# Patient Record
Sex: Male | Born: 1980 | Race: White | Hispanic: No | Marital: Single | Smoking: Current every day smoker
Health system: Southern US, Community
[De-identification: ages and names within clinical notes are randomized; demographics above are authoritative.]

## PROBLEM LIST (undated history)

## (undated) DIAGNOSIS — S82892A Other fracture of left lower leg, initial encounter for closed fracture: Secondary | ICD-10-CM

## (undated) DIAGNOSIS — I10 Essential (primary) hypertension: Secondary | ICD-10-CM

## (undated) HISTORY — PX: MANDIBLE SURGERY: SHX707

## (undated) HISTORY — DX: Essential (primary) hypertension: I10

## (undated) HISTORY — PX: BLADDER REPAIR: SHX76

---

## 2003-04-15 ENCOUNTER — Other Ambulatory Visit (HOSPITAL_COMMUNITY): Admission: RE | Admit: 2003-04-15 | Discharge: 2003-04-18 | Payer: Self-pay | Admitting: Psychiatry

## 2004-07-05 ENCOUNTER — Emergency Department (HOSPITAL_COMMUNITY): Admission: EM | Admit: 2004-07-05 | Discharge: 2004-07-05 | Payer: Self-pay | Admitting: Emergency Medicine

## 2005-08-10 ENCOUNTER — Ambulatory Visit: Payer: Self-pay | Admitting: Licensed Clinical Social Worker

## 2005-12-28 ENCOUNTER — Emergency Department (HOSPITAL_COMMUNITY): Admission: EM | Admit: 2005-12-28 | Discharge: 2005-12-28 | Payer: Self-pay | Admitting: Emergency Medicine

## 2009-02-21 ENCOUNTER — Encounter (HOSPITAL_BASED_OUTPATIENT_CLINIC_OR_DEPARTMENT_OTHER): Admission: RE | Admit: 2009-02-21 | Discharge: 2009-04-08 | Payer: Self-pay | Admitting: General Surgery

## 2009-10-21 ENCOUNTER — Emergency Department (HOSPITAL_COMMUNITY): Admission: EM | Admit: 2009-10-21 | Discharge: 2009-10-21 | Payer: Self-pay

## 2010-05-19 ENCOUNTER — Encounter (HOSPITAL_BASED_OUTPATIENT_CLINIC_OR_DEPARTMENT_OTHER): Payer: Self-pay | Attending: General Surgery

## 2010-05-19 DIAGNOSIS — L02219 Cutaneous abscess of trunk, unspecified: Secondary | ICD-10-CM | POA: Insufficient documentation

## 2010-05-19 DIAGNOSIS — F17200 Nicotine dependence, unspecified, uncomplicated: Secondary | ICD-10-CM | POA: Insufficient documentation

## 2010-05-19 DIAGNOSIS — L03319 Cellulitis of trunk, unspecified: Secondary | ICD-10-CM | POA: Insufficient documentation

## 2010-06-02 ENCOUNTER — Encounter (HOSPITAL_BASED_OUTPATIENT_CLINIC_OR_DEPARTMENT_OTHER): Payer: Self-pay | Attending: General Surgery

## 2010-06-02 DIAGNOSIS — L02219 Cutaneous abscess of trunk, unspecified: Secondary | ICD-10-CM | POA: Insufficient documentation

## 2010-06-02 DIAGNOSIS — F17200 Nicotine dependence, unspecified, uncomplicated: Secondary | ICD-10-CM | POA: Insufficient documentation

## 2010-06-02 DIAGNOSIS — L03319 Cellulitis of trunk, unspecified: Secondary | ICD-10-CM | POA: Insufficient documentation

## 2010-07-14 ENCOUNTER — Encounter (HOSPITAL_BASED_OUTPATIENT_CLINIC_OR_DEPARTMENT_OTHER): Payer: Self-pay | Attending: General Surgery

## 2010-07-14 DIAGNOSIS — I10 Essential (primary) hypertension: Secondary | ICD-10-CM | POA: Insufficient documentation

## 2010-07-14 DIAGNOSIS — S21209A Unspecified open wound of unspecified back wall of thorax without penetration into thoracic cavity, initial encounter: Secondary | ICD-10-CM | POA: Insufficient documentation

## 2010-07-14 DIAGNOSIS — X58XXXA Exposure to other specified factors, initial encounter: Secondary | ICD-10-CM | POA: Insufficient documentation

## 2010-08-07 ENCOUNTER — Encounter (INDEPENDENT_AMBULATORY_CARE_PROVIDER_SITE_OTHER): Payer: Self-pay | Admitting: General Surgery

## 2010-08-10 ENCOUNTER — Ambulatory Visit (INDEPENDENT_AMBULATORY_CARE_PROVIDER_SITE_OTHER): Payer: Self-pay | Admitting: General Surgery

## 2010-08-10 ENCOUNTER — Encounter (INDEPENDENT_AMBULATORY_CARE_PROVIDER_SITE_OTHER): Payer: Self-pay | Admitting: General Surgery

## 2010-08-10 DIAGNOSIS — S21209A Unspecified open wound of unspecified back wall of thorax without penetration into thoracic cavity, initial encounter: Secondary | ICD-10-CM
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
## 2010-08-10 NOTE — Patient Instructions (Signed)
Schedule for surgery after August 11th.

## 2010-08-11 ENCOUNTER — Encounter (HOSPITAL_BASED_OUTPATIENT_CLINIC_OR_DEPARTMENT_OTHER): Payer: Self-pay

## 2010-08-25 ENCOUNTER — Encounter (HOSPITAL_BASED_OUTPATIENT_CLINIC_OR_DEPARTMENT_OTHER): Payer: Self-pay | Attending: General Surgery

## 2010-08-25 DIAGNOSIS — X58XXXA Exposure to other specified factors, initial encounter: Secondary | ICD-10-CM | POA: Insufficient documentation

## 2010-08-25 DIAGNOSIS — S21209A Unspecified open wound of unspecified back wall of thorax without penetration into thoracic cavity, initial encounter: Secondary | ICD-10-CM | POA: Insufficient documentation

## 2010-09-08 ENCOUNTER — Encounter (HOSPITAL_BASED_OUTPATIENT_CLINIC_OR_DEPARTMENT_OTHER): Payer: Self-pay | Attending: General Surgery

## 2010-09-08 DIAGNOSIS — X58XXXA Exposure to other specified factors, initial encounter: Secondary | ICD-10-CM | POA: Insufficient documentation

## 2010-09-08 DIAGNOSIS — S21209A Unspecified open wound of unspecified back wall of thorax without penetration into thoracic cavity, initial encounter: Secondary | ICD-10-CM | POA: Insufficient documentation

## 2010-09-23 ENCOUNTER — Ambulatory Visit (HOSPITAL_COMMUNITY): Admission: RE | Admit: 2010-09-23 | Payer: Self-pay | Source: Ambulatory Visit | Admitting: General Surgery
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
## 2012-02-22 IMAGING — CR DG WRIST COMPLETE 3+V*L*
3 series · 3 of 3 positions shown · non-contrast
Comparison: 10/21/2009

CLINICAL DATA: Splinting of the wrist.

LEFT WRIST - COMPLETE 3+ VIEW

[x wrist pa left]
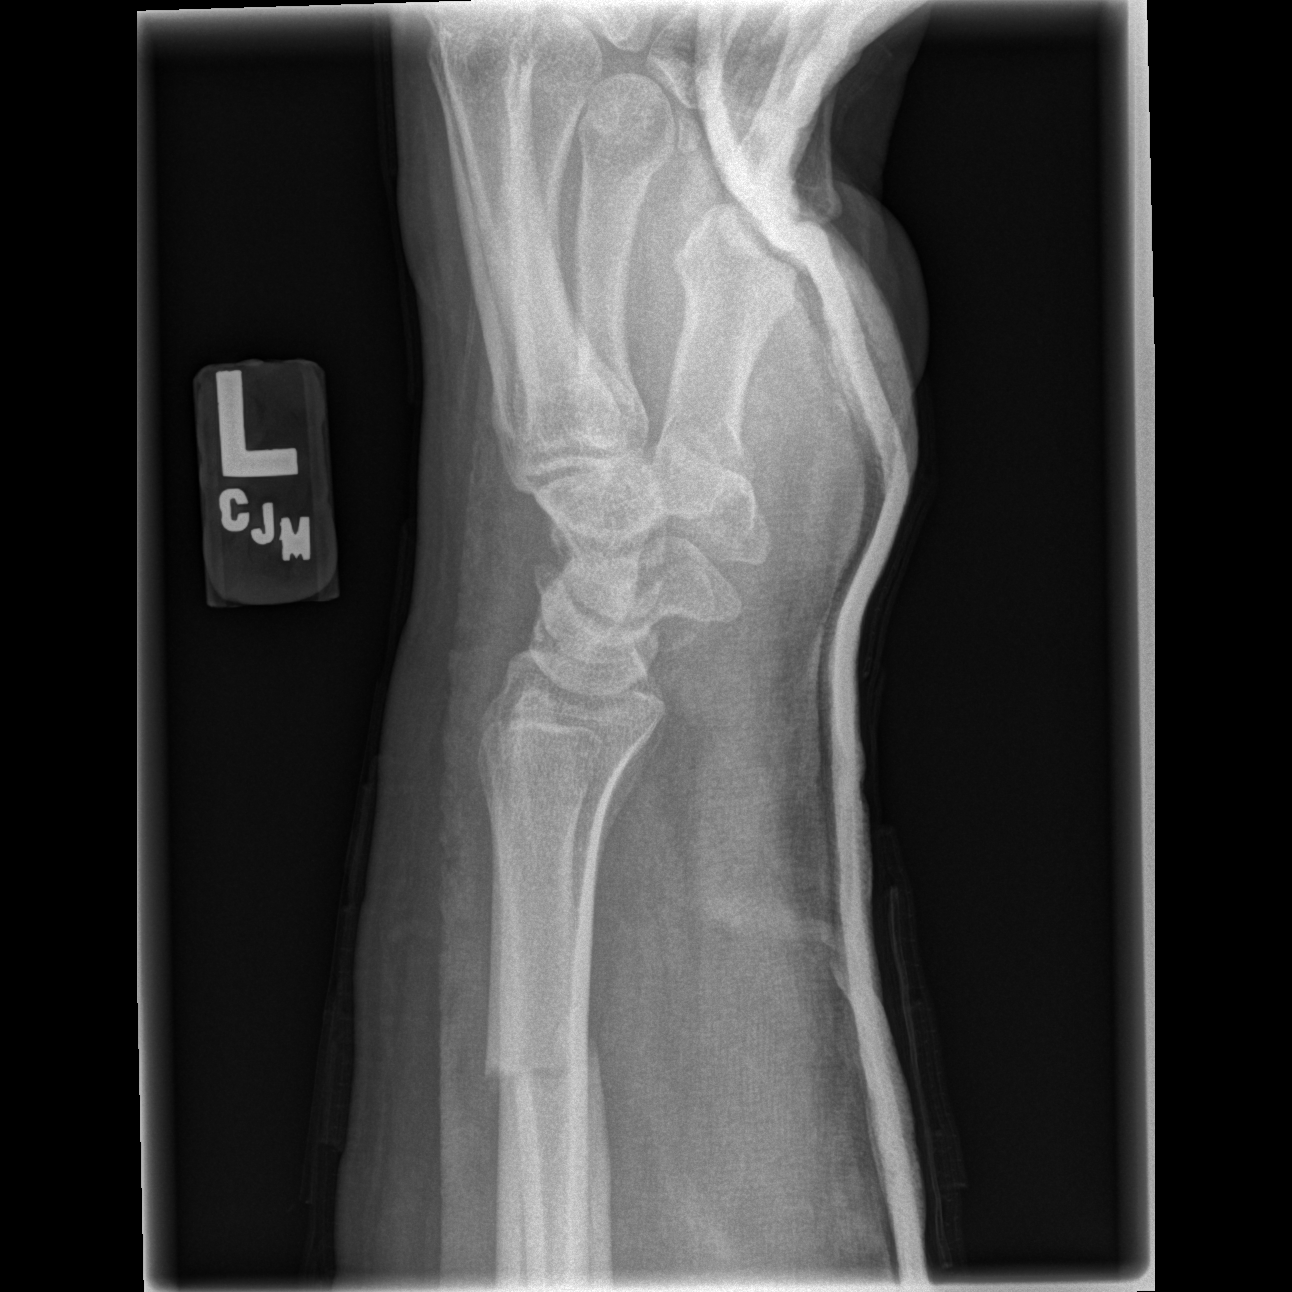

[x wrist obl left]
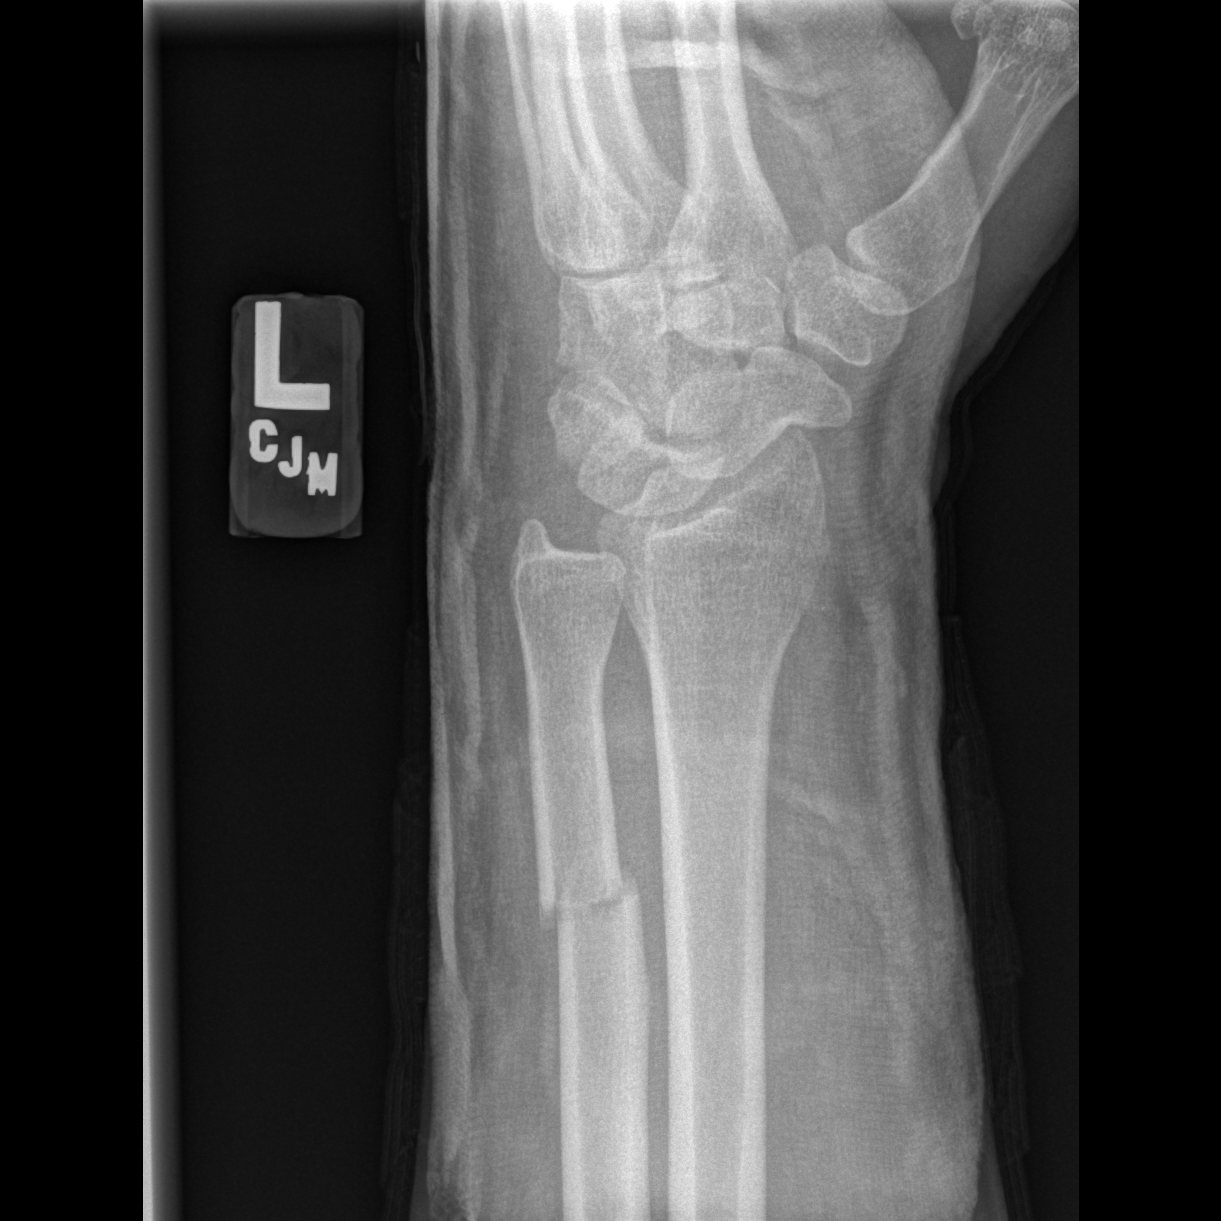

[x wrist lat left]
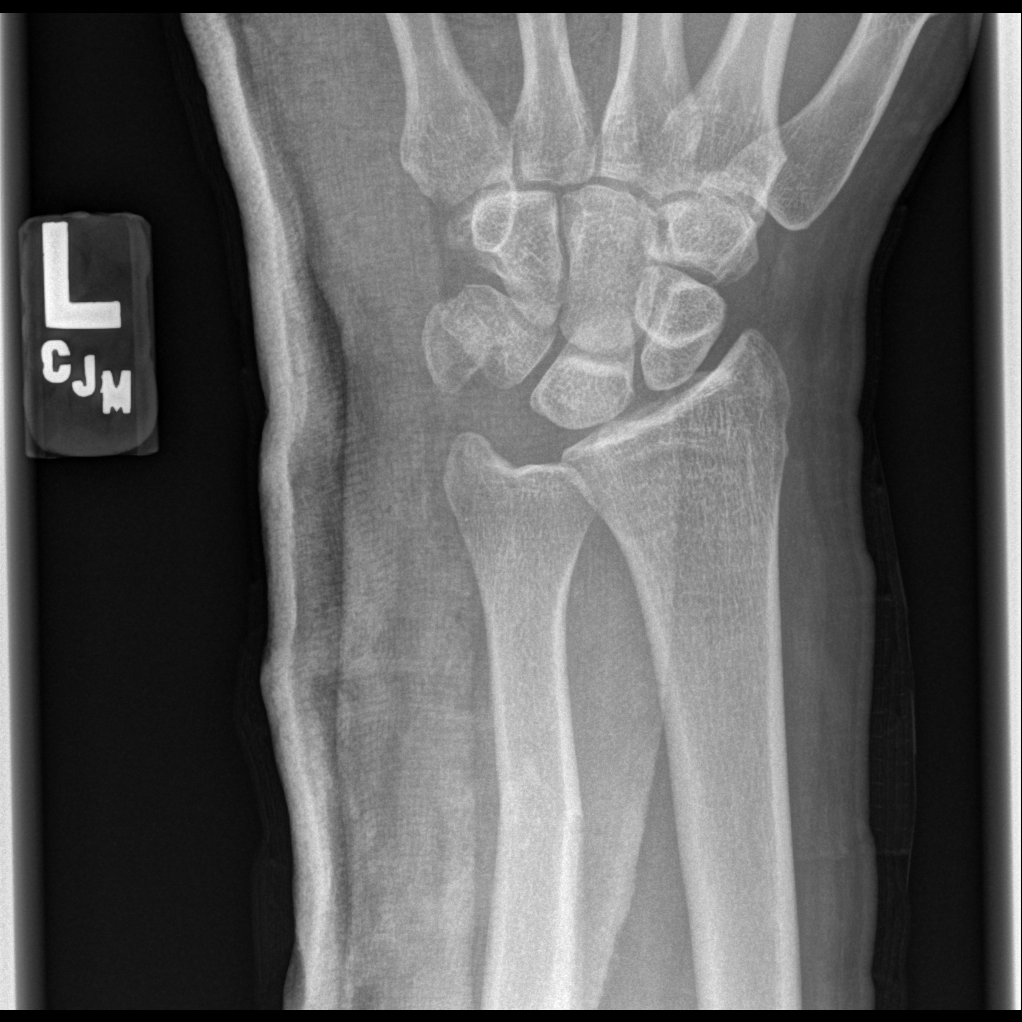

[3 of 3 positions shown; findings below may reference images not displayed]

FINDINGS: Patient has undergone reduction of distal ulnar fracture.
Alignment is near anatomic.  Detail is obscured by splinting
material.  No evidence for displaced radial fracture.
IMPRESSION: Status post reduction of ulnar fracture.

## 2013-05-02 DIAGNOSIS — S82892A Other fracture of left lower leg, initial encounter for closed fracture: Secondary | ICD-10-CM

## 2013-05-02 HISTORY — DX: Other fracture of left lower leg, initial encounter for closed fracture: S82.892A

## 2013-05-28 ENCOUNTER — Encounter (HOSPITAL_BASED_OUTPATIENT_CLINIC_OR_DEPARTMENT_OTHER): Payer: Self-pay | Admitting: *Deleted

## 2013-05-28 NOTE — H&P (Signed)
  [facility] [address]   [address] [place], [place] [phone] A Division of [facility]  Loreta Aveaniel F. Meziah Blasingame, M.D.   Robert A. Thurston HoleWainer, M.D.   Burnell BlanksW. Dan Caffrey, M.D.   Eulas PostJoshua P. Landau, M.D.   Lunette StandsAnna Voytek, M.D Jewel Baizeimothy D. Eulah PontMurphy, M.D.  Buford DresserWesley R. Ibazebo, M.D.  Estell HarpinJames S. Kramer, M.D.    Melina Fiddlerebecca S. Bassett, M.D. Mary L. Isidoro DonningAnton, PA-C  Kirstin A. Shepperson, PA-C  Josh Boylehadwell, PA-C CubaBrandon Parry, [place]A-C   RE: Greig RightBechtel, Lawson   [phone]      DOB: 11/26/1980 PROGRESS NOTE: 05-23-13 Reason for visit:  Left ankle injury, referral from Dr. Sherlean FootLucey. Date of injury: 4/21. History of present illness: Late that evening he stepped wrong and twisted his ankle off the curb. He had immediate pain in his left ankle. He was splinted by Dr. Sherlean FootLucey yesterday and referred to me.    Please see associated documentation for this clinic visit for further past medical, family, surgical and social history, review of systems, and exam findings as this was reviewed by me.  EXAMINATION: Well appearing male in no apparent distress. Splint is clean dry and intact. He has sensation intact to his toes and is able to wiggle his toes without pain.   IMAGING: X-rays reviewed by me: 3 views of the ankle demonstrate functional bi-mal with possible syndesmotic injury.     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  PLAN: 1. I discussed options. I feel he would benefit with open reduction internal fixation of this with possible syndesmotic fixation, we will schedule this within the next week. 2. He will elevate as much as possible.   Jewel Baizeimothy D.  Eulah PontMurphy, M.D.  Electronically verified by Jewel Baizeimothy D. Eulah PontMurphy, M.D. TDM:kah D 05-23-13 T 05-24-13

## 2013-05-29 ENCOUNTER — Encounter (HOSPITAL_BASED_OUTPATIENT_CLINIC_OR_DEPARTMENT_OTHER): Payer: Self-pay | Admitting: Anesthesiology

## 2013-05-29 ENCOUNTER — Ambulatory Visit (HOSPITAL_BASED_OUTPATIENT_CLINIC_OR_DEPARTMENT_OTHER)
Admission: RE | Admit: 2013-05-29 | Discharge: 2013-05-29 | Disposition: A | Discharge status: Home / Self Care | Payer: Self-pay | Source: Ambulatory Visit | Attending: Orthopedic Surgery | Admitting: Orthopedic Surgery

## 2013-05-29 ENCOUNTER — Encounter (HOSPITAL_BASED_OUTPATIENT_CLINIC_OR_DEPARTMENT_OTHER)
Admission: RE | Disposition: A | Discharge status: Home / Self Care | Payer: Self-pay | Source: Ambulatory Visit | Attending: Orthopedic Surgery

## 2013-05-29 ENCOUNTER — Ambulatory Visit (HOSPITAL_BASED_OUTPATIENT_CLINIC_OR_DEPARTMENT_OTHER): Payer: 59 | Admitting: Anesthesiology

## 2013-05-29 DIAGNOSIS — Y929 Unspecified place or not applicable: Secondary | ICD-10-CM | POA: Insufficient documentation

## 2013-05-29 DIAGNOSIS — F17200 Nicotine dependence, unspecified, uncomplicated: Secondary | ICD-10-CM | POA: Insufficient documentation

## 2013-05-29 DIAGNOSIS — X500XXA Overexertion from strenuous movement or load, initial encounter: Secondary | ICD-10-CM | POA: Insufficient documentation

## 2013-05-29 DIAGNOSIS — I10 Essential (primary) hypertension: Secondary | ICD-10-CM | POA: Insufficient documentation

## 2013-05-29 DIAGNOSIS — S8263XA Displaced fracture of lateral malleolus of unspecified fibula, initial encounter for closed fracture: Secondary | ICD-10-CM | POA: Insufficient documentation

## 2013-05-29 DIAGNOSIS — S93439A Sprain of tibiofibular ligament of unspecified ankle, initial encounter: Secondary | ICD-10-CM | POA: Insufficient documentation

## 2013-05-29 HISTORY — PX: ORIF ANKLE FRACTURE: SHX5408

## 2013-05-29 LAB — POCT HEMOGLOBIN-HEMACUE: Hemoglobin: 15.5 g/dL (ref 13.0–17.0)

## 2013-05-29 SURGERY — OPEN REDUCTION INTERNAL FIXATION (ORIF) ANKLE FRACTURE
Anesthesia: Regional | Site: Ankle | Laterality: Left

## 2013-05-29 MED ORDER — ACETAMINOPHEN 500 MG PO TABS
ORAL_TABLET | ORAL | Status: AC
  Filled 2013-05-29: qty 2

## 2013-05-29 MED ORDER — DEXAMETHASONE SODIUM PHOSPHATE 4 MG/ML IJ SOLN
INTRAMUSCULAR | Status: DC | PRN
Start: 2013-05-29 — End: 2013-05-29
  Administered 2013-05-29: 10 mg via INTRAVENOUS

## 2013-05-29 MED ORDER — FENTANYL CITRATE 0.05 MG/ML IJ SOLN
50.0000 ug | INTRAMUSCULAR | Status: DC | PRN
  Administered 2013-05-29: 100 ug via INTRAVENOUS

## 2013-05-29 MED ORDER — PROPOFOL 10 MG/ML IV BOLUS
INTRAVENOUS | Status: DC | PRN
  Administered 2013-05-29: 200 mg via INTRAVENOUS

## 2013-05-29 MED ORDER — HYDROMORPHONE HCL PF 1 MG/ML IJ SOLN
INTRAMUSCULAR | Status: AC
  Filled 2013-05-29: qty 1

## 2013-05-29 MED ORDER — CEFAZOLIN SODIUM-DEXTROSE 2-3 GM-% IV SOLR
INTRAVENOUS | Status: AC
  Filled 2013-05-29: qty 50

## 2013-05-29 MED ORDER — ONDANSETRON HCL 4 MG/2ML IJ SOLN
INTRAMUSCULAR | Status: DC | PRN
  Administered 2013-05-29: 4 mg via INTRAVENOUS

## 2013-05-29 MED ORDER — ONDANSETRON HCL 4 MG PO TABS: 4.0000 mg | ORAL_TABLET | Freq: Three times a day (TID) | ORAL | Status: DC | PRN

## 2013-05-29 MED ORDER — MIDAZOLAM HCL 2 MG/ML PO SYRP: 12.0000 mg | ORAL_SOLUTION | Freq: Once | ORAL | Status: DC | PRN

## 2013-05-29 MED ORDER — DEXTROSE-NACL 5-0.45 % IV SOLN: 100.0000 mL/h | INTRAVENOUS | Status: DC

## 2013-05-29 MED ORDER — DOCUSATE SODIUM 100 MG PO CAPS: 100.0000 mg | ORAL_CAPSULE | Freq: Two times a day (BID) | ORAL | Status: DC

## 2013-05-29 MED ORDER — FENTANYL CITRATE 0.05 MG/ML IJ SOLN
INTRAMUSCULAR | Status: AC
  Filled 2013-05-29: qty 6

## 2013-05-29 MED ORDER — MIDAZOLAM HCL 5 MG/5ML IJ SOLN
INTRAMUSCULAR | Status: DC | PRN
  Administered 2013-05-29: 2 mg via INTRAVENOUS

## 2013-05-29 MED ORDER — ASPIRIN 81 MG PO TABS: 81.0000 mg | ORAL_TABLET | Freq: Every day | ORAL | Status: AC

## 2013-05-29 MED ORDER — MIDAZOLAM HCL 2 MG/2ML IJ SOLN
INTRAMUSCULAR | Status: AC
  Filled 2013-05-29: qty 2

## 2013-05-29 MED ORDER — ONDANSETRON HCL 4 MG/2ML IJ SOLN: 4.0000 mg | Freq: Four times a day (QID) | INTRAMUSCULAR | Status: DC | PRN

## 2013-05-29 MED ORDER — ACETAMINOPHEN 500 MG PO TABS: 1000.0000 mg | ORAL_TABLET | Freq: Once | ORAL | Status: DC

## 2013-05-29 MED ORDER — OXYCODONE HCL 5 MG/5ML PO SOLN: 5.0000 mg | Freq: Once | ORAL | Status: AC | PRN

## 2013-05-29 MED ORDER — EPHEDRINE SULFATE 50 MG/ML IJ SOLN
INTRAMUSCULAR | Status: DC | PRN
  Administered 2013-05-29: 10 mg via INTRAVENOUS

## 2013-05-29 MED ORDER — MIDAZOLAM HCL 2 MG/2ML IJ SOLN
1.0000 mg | INTRAMUSCULAR | Status: DC | PRN
  Administered 2013-05-29: 2 mg via INTRAVENOUS

## 2013-05-29 MED ORDER — FENTANYL CITRATE 0.05 MG/ML IJ SOLN
INTRAMUSCULAR | Status: AC
  Filled 2013-05-29: qty 2

## 2013-05-29 MED ORDER — LIDOCAINE HCL (CARDIAC) 20 MG/ML IV SOLN
INTRAVENOUS | Status: DC | PRN
  Administered 2013-05-29: 40 mg via INTRAVENOUS

## 2013-05-29 MED ORDER — BUPIVACAINE-EPINEPHRINE PF 0.5-1:200000 % IJ SOLN
INTRAMUSCULAR | Status: DC | PRN
  Administered 2013-05-29: 30 mL via PERINEURAL

## 2013-05-29 MED ORDER — LACTATED RINGERS IV SOLN
INTRAVENOUS | Status: DC
Start: 2013-05-29 — End: 2013-05-29
  Administered 2013-05-29 (×3): via INTRAVENOUS

## 2013-05-29 MED ORDER — FENTANYL CITRATE 0.05 MG/ML IJ SOLN
INTRAMUSCULAR | Status: DC | PRN
  Administered 2013-05-29: 100 ug via INTRAVENOUS

## 2013-05-29 MED ORDER — CEFAZOLIN SODIUM-DEXTROSE 2-3 GM-% IV SOLR: 2.0000 g | INTRAVENOUS | Status: DC

## 2013-05-29 MED ORDER — OXYCODONE HCL 5 MG PO TABS: 10.0000 mg | ORAL_TABLET | ORAL | Status: AC | PRN

## 2013-05-29 MED ORDER — ACETAMINOPHEN 10 MG/ML IV SOLN
INTRAVENOUS | Status: DC | PRN
  Administered 2013-05-29: 1000 mg via INTRAVENOUS

## 2013-05-29 MED ORDER — OXYCODONE HCL 5 MG PO TABS
ORAL_TABLET | ORAL | Status: AC
  Filled 2013-05-29: qty 1

## 2013-05-29 MED ORDER — ACETAMINOPHEN 10 MG/ML IV SOLN
INTRAVENOUS | Status: AC
  Filled 2013-05-29: qty 100

## 2013-05-29 MED ORDER — 0.9 % SODIUM CHLORIDE (POUR BTL) OPTIME
TOPICAL | Status: DC | PRN
  Administered 2013-05-29: 400 mL

## 2013-05-29 MED ORDER — OXYCODONE HCL 5 MG PO TABS
5.0000 mg | ORAL_TABLET | Freq: Once | ORAL | Status: AC | PRN
  Administered 2013-05-29: 5 mg via ORAL

## 2013-05-29 MED ORDER — HYDROMORPHONE HCL PF 1 MG/ML IJ SOLN
0.2500 mg | INTRAMUSCULAR | Status: DC | PRN
  Administered 2013-05-29 (×4): 0.5 mg via INTRAVENOUS

## 2013-05-29 SURGICAL SUPPLY — 68 items
BANDAGE ELASTIC 4 VELCRO ST LF (GAUZE/BANDAGES/DRESSINGS) ×3 IMPLANT
BANDAGE ELASTIC 6 VELCRO ST LF (GAUZE/BANDAGES/DRESSINGS) ×3 IMPLANT
BANDAGE ESMARK 6X9 LF (GAUZE/BANDAGES/DRESSINGS) ×1 IMPLANT
BIT DRILL 2.5X125 (BIT) ×3 IMPLANT
BIT DRILL 3.5X125 (BIT) ×1 IMPLANT
BLADE 15 SAFETY STRL DISP (BLADE) ×6 IMPLANT
BNDG COHESIVE 4X5 TAN STRL (GAUZE/BANDAGES/DRESSINGS) ×3 IMPLANT
BNDG ESMARK 6X9 LF (GAUZE/BANDAGES/DRESSINGS) ×3
CHLORAPREP W/TINT 26ML (MISCELLANEOUS) ×3 IMPLANT
CLOSURE WOUND 1/2 X4 (GAUZE/BANDAGES/DRESSINGS) ×1
COVER MAYO STAND STRL (DRAPES) IMPLANT
COVER TABLE BACK 60X90 (DRAPES) ×3 IMPLANT
CUFF TOURNIQUET SINGLE 24IN (TOURNIQUET CUFF) IMPLANT
CUFF TOURNIQUET SINGLE 34IN LL (TOURNIQUET CUFF) ×3 IMPLANT
DECANTER SPIKE VIAL GLASS SM (MISCELLANEOUS) IMPLANT
DRAPE C-ARM 42X72 X-RAY (DRAPES) IMPLANT
DRAPE C-ARMOR (DRAPES) IMPLANT
DRAPE EXTREMITY T 121X128X90 (DRAPE) ×3 IMPLANT
DRAPE OEC MINIVIEW 54X84 (DRAPES) ×3 IMPLANT
DRAPE U 20/CS (DRAPES) ×3 IMPLANT
DRAPE U-SHAPE 47X51 STRL (DRAPES) ×3 IMPLANT
DRILL BIT 3.5X125 (BIT) ×2
DRSG EMULSION OIL 3X3 NADH (GAUZE/BANDAGES/DRESSINGS) ×3 IMPLANT
ELECT REM PT RETURN 9FT ADLT (ELECTROSURGICAL) ×3
ELECTRODE REM PT RTRN 9FT ADLT (ELECTROSURGICAL) ×1 IMPLANT
GAUZE SPONGE 4X4 12PLY STRL (GAUZE/BANDAGES/DRESSINGS) ×3 IMPLANT
GLOVE BIO SURGEON STRL SZ 6.5 (GLOVE) ×2 IMPLANT
GLOVE BIO SURGEON STRL SZ7.5 (GLOVE) ×3 IMPLANT
GLOVE BIO SURGEONS STRL SZ 6.5 (GLOVE) ×1
GLOVE BIOGEL PI IND STRL 7.0 (GLOVE) ×1 IMPLANT
GLOVE BIOGEL PI IND STRL 8 (GLOVE) ×3 IMPLANT
GLOVE BIOGEL PI INDICATOR 7.0 (GLOVE) ×2
GLOVE BIOGEL PI INDICATOR 8 (GLOVE) ×6
GLOVE EXAM NITRILE LRG STRL (GLOVE) ×3 IMPLANT
GOWN STRL REUS W/ TWL LRG LVL3 (GOWN DISPOSABLE) ×2 IMPLANT
GOWN STRL REUS W/ TWL XL LVL3 (GOWN DISPOSABLE) ×1 IMPLANT
GOWN STRL REUS W/TWL LRG LVL3 (GOWN DISPOSABLE) ×4
GOWN STRL REUS W/TWL XL LVL3 (GOWN DISPOSABLE) ×2
NEEDLE HYPO 22GX1.5 SAFETY (NEEDLE) IMPLANT
NS IRRIG 1000ML POUR BTL (IV SOLUTION) ×3 IMPLANT
PACK BASIN DAY SURGERY FS (CUSTOM PROCEDURE TRAY) ×3 IMPLANT
PAD CAST 4YDX4 CTTN HI CHSV (CAST SUPPLIES) ×1 IMPLANT
PADDING CAST COTTON 4X4 STRL (CAST SUPPLIES) ×2
PADDING CAST COTTON 6X4 STRL (CAST SUPPLIES) ×3 IMPLANT
PENCIL BUTTON HOLSTER BLD 10FT (ELECTRODE) ×3 IMPLANT
PLATE TUBUAL 1/3 6H (Plate) ×3 IMPLANT
SCREW CANCELLOUS FT 4.0X18MM (Screw) ×3 IMPLANT
SCREW CORTEX ST MATTA 3.5X16MM (Screw) ×9 IMPLANT
SCREW CORTEX ST MATTA 3.5X22MM (Screw) ×3 IMPLANT
SLEEVE SCD COMPRESS KNEE MED (MISCELLANEOUS) ×3 IMPLANT
SPLINT FAST PLASTER 5X30 (CAST SUPPLIES) ×30
SPLINT PLASTER CAST FAST 5X30 (CAST SUPPLIES) ×15 IMPLANT
SPONGE LAP 4X18 X RAY DECT (DISPOSABLE) ×3 IMPLANT
STRIP CLOSURE SKIN 1/2X4 (GAUZE/BANDAGES/DRESSINGS) ×2 IMPLANT
SUCTION FRAZIER TIP 10 FR DISP (SUCTIONS) ×3 IMPLANT
SUT MON AB 2-0 CT1 36 (SUTURE) ×3 IMPLANT
SUT MON AB 4-0 PC3 18 (SUTURE) ×3 IMPLANT
SUT VIC AB 0 SH 27 (SUTURE) IMPLANT
SUT VIC AB 2-0 SH 27 (SUTURE) ×2
SUT VIC AB 2-0 SH 27XBRD (SUTURE) ×1 IMPLANT
SYR 20CC LL (SYRINGE) IMPLANT
SYR BULB 3OZ (MISCELLANEOUS) ×3 IMPLANT
TIGHTROPE SYNDESMOSIS (Orthopedic Implant) ×3 IMPLANT
TOWEL OR 17X24 6PK STRL BLUE (TOWEL DISPOSABLE) ×6 IMPLANT
TOWEL OR NON WOVEN STRL DISP B (DISPOSABLE) ×3 IMPLANT
TUBE CONNECTING 20'X1/4 (TUBING) ×1
TUBE CONNECTING 20X1/4 (TUBING) ×2 IMPLANT
UNDERPAD 30X30 INCONTINENT (UNDERPADS AND DIAPERS) ×3 IMPLANT

## 2013-05-29 NOTE — Op Note (Signed)
05/29/2013  10:50 AM  PATIENT:  [name]    PRE-OPERATIVE DIAGNOSIS:  LEFT FRACTURE ANKLE LATERAL MALLEOLUS/DISTAL FIBULA-CLOSED, FRACTURE ANKLE UNSPECIFIED-CLOSED  POST-OPERATIVE DIAGNOSIS:  Same  PROCEDURE:  LEFT ANKLE OPEN TREATMENT DISTAL FIBULAR (LATERAL MALLEOLUS) INCLUDES INTERNAL FIXATION/LEFT ANKLE FRACTURE OPEN TREATMENT DISTAL TIBIOFIBULAR INCLUDES INTERNAL FIXATION  SURGEON:  Sheral Apleyimothy D Murphy, MD  ASSISTANT: Janace LittenBrandon Parry OPA  ANESTHESIA:   Gen  PREOPERATIVE INDICATIONS:  [name] is a  33 y.o. male with a diagnosis of LEFT FRACTURE ANKLE LATERAL MALLEOLUS/DISTAL FIBULA-CLOSED, FRACTURE ANKLE UNSPECIFIED-CLOSED who failed conservative measures and elected for surgical management.    The risks benefits and alternatives were discussed with the patient preoperatively including but not limited to the risks of infection, bleeding, nerve injury, cardiopulmonary complications, the need for revision surgery, among others, and the patient was willing to proceed.  OPERATIVE IMPLANTS: stryker 1/3 plate, arthrex stainless tight rope  OPERATIVE FINDINGS: Unstable ankle fracture. Stable syndesmosis post op  BLOOD LOSS: min  COMPLICATIONS: none  TOURNIQUET TIME: 40min  OPERATIVE PROCEDURE:  Patient was identified in the preoperative holding area and site was marked by me He was transported to the operating theater and placed on the table in supine position taking care to pad all bony prominences. After a preincinduction time out anesthesia was induced. The left lower extremity was prepped and draped in normal sterile fashion and a pre-incision timeout was performed. [name] received ancef for preoperative antibiotics.   I made a lateral incision of roughly 7 cm dissection was carried down sharply to the distal fibula and then spreading dissection was used proximally to protect the superficial peroneal nerve. I sharply incised the periosteum and took care to  protect the peroneal tendons. I then debrided the fracture site and performed a reduction maneuver which was held in place with a clamp.   I placed a lag screw across the fracture  I then selected a 6-hole one third tubular plate and placed in a neutralization fashion care was taken distally so as not to penetrate the joint with the cancellus screws. The plate was placed posterior to so as not to be prominent I get the proximal to the watershed line to not irritate the peroneal tendons.  I noted that there was a dry mouth component but it was small enough to not require fixation.  I then stressed the syndesmosis and it opened up for syndesmotic fixation I performed a reduction maneuver and placed an arthrex tightrope  The wound was then thoroughly irrigated and closed using a 0 Vicryl and absorbable Monocryl sutures. He was placed in a short leg splint.   POST OPERATIVE PLAN: Non-weightbearing. DVT prophylaxis will consist of ASA 81

## 2013-05-29 NOTE — Interval H&P Note (Signed)
History and Physical Interval Note:  05/29/2013 9:51 AM  Neil CrouchJeffrey S Bobb  has presented today for surgery, with the diagnosis of LEFT FRACTURE ANKLE LATERAL MALLEOLUS/DISTAL FIBULA-CLOSED, FRAC ANKLE UNSPECIFIED-CLOSED  The various methods of treatment have been discussed with the patient and family. After consideration of risks, benefits and other options for treatment, the patient has consented to  Procedure(s): LEFT ANKLE OPEN TREATMENT DISTAL FIBULAR (LATERAL MALLEOLUS) INCLUDES INTERNAL FIXATION/LEFT ANKLE FRACTURE OPEN TREATMENT DISTAL TIBIOFIBULAR INCLUDES INTERNAL FIXATION (Left) as a surgical intervention .  The patient's history has been reviewed, patient examined, no change in status, stable for surgery.  I have reviewed the patient's chart and labs.  Questions were answered to the patient's satisfaction.     Sheral Apleyimothy D Kynadi Dragos

## 2013-05-29 NOTE — Discharge Instructions (Signed)
Elevate foot above your head  No weight on your foot  Take an ASA 81 daily for 30 days  Keep your splint intact and dry  Post Anesthesia Home Care Instructions  Activity: Get plenty of rest for the remainder of the day. A responsible adult should stay with you for 24 hours following the procedure.  For the next 24 hours, DO NOT: -Drive a car -Advertising copywriterperate machinery -Drink alcoholic beverages -Take any medication unless instructed by your physician -Make any legal decisions or sign important papers.  Meals: Start with liquid foods such as gelatin or soup. Progress to regular foods as tolerated. Avoid greasy, spicy, heavy foods. If nausea and/or vomiting occur, drink only clear liquids until the nausea and/or vomiting subsides. Call your physician if vomiting continues.  Special Instructions/Symptoms: Your throat may feel dry or sore from the anesthesia or the breathing tube placed in your throat during surgery. If this causes discomfort, gargle with warm salt water. The discomfort should disappear within 24 hours.   Call your surgeon if you experience:   1.  Fever over 101.0. 2.  Inability to urinate. 3.  Nausea and/or vomiting. 4.  Extreme swelling or bruising at the surgical site. 5.  Continued bleeding from the incision. 6.  Increased pain, redness or drainage from the incision. 7.  Problems related to your pain medication  .Regional Anesthesia Blocks  1. Numbness or the inability to move the "blocked" extremity may last from 3-48 hours after placement. The length of time depends on the medication injected and your individual response to the medication. If the numbness is not going away after 48 hours, call your surgeon.  2. The extremity that is blocked will need to be protected until the numbness is gone and the  Strength has returned. Because you cannot feel it, you will need to take extra care to avoid injury. Because it may be weak, you may have difficulty moving it or using  it. You may not know what position it is in without looking at it while the block is in effect.  3. For blocks in the legs and feet, returning to weight bearing and walking needs to be done carefully. You will need to wait until the numbness is entirely gone and the strength has returned. You should be able to move your leg and foot normally before you try and bear weight or walk. You will need someone to be with you when you first try to ensure you do not fall and possibly risk injury.  4. Bruising and tenderness at the needle site are common side effects and will resolve in a few days.  5. Persistent numbness or new problems with movement should be communicated to the surgeon or the [facility] [phone])/ [facility] [place] [phone]).

## 2013-05-29 NOTE — Transfer of Care (Signed)
Immediate Anesthesia Transfer of Care Note  Patient: Malik CrouchJeffrey S Hansen  Procedure(s) Performed: Procedure(s): Open reduction and internal fixation left ankle with repair syndesmosis (Left)  Patient Location: PACU  Anesthesia Type:GA combined with regional for post-op pain  Level of Consciousness: awake, alert  and patient cooperative  Airway & Oxygen Therapy: Patient Spontanous Breathing and Patient connected to face mask oxygen  Post-op Assessment: Report given to PACU RN and Post -op Vital signs reviewed and stable  Post vital signs: Reviewed and stable  Complications: No apparent anesthesia complications

## 2013-05-29 NOTE — Progress Notes (Signed)
Assisted Dr. Hodierne with left, ultrasound guided, popliteal block. Side rails up, monitors on throughout procedure. See vital signs in flow sheet. Tolerated Procedure well. 

## 2013-05-29 NOTE — Anesthesia Procedure Notes (Addendum)
Anesthesia Regional Block:  Popliteal block  Pre-Anesthetic Checklist: ,, timeout performed, Correct Patient, Correct Site, Correct Laterality, Correct Procedure, Correct Position, site marked, Risks and benefits discussed,  Surgical consent,  Pre-op evaluation,  At surgeon's request and post-op pain management  Laterality: Left  Prep: chloraprep       Needles:  Injection technique: Single-shot  Needle Type: Echogenic Stimulator Needle     Needle Length: 9cm 9 cm Needle Gauge: 21 and 21 G    Additional Needles:  Procedures: ultrasound guided (picture in chart) and nerve stimulator Popliteal block  Nerve Stimulator or Paresthesia:  Response: plantar flexion of foot, 0.45 mA,   Additional Responses:   Narrative:  Start time: 05/29/2013 8:24 AM End time: 05/29/2013 8:37 AM Injection made incrementally with aspirations every 5 mL.  Performed by: Personally  Anesthesiologist: Dr Chaney MallingHodierne  Additional Notes: Functioning IV was confirmed and monitors were applied.  A 90mm 21ga Arrow echogenic stimulator needle was used. Sterile prep and drape,hand hygiene and sterile gloves were used.  Negative aspiration and negative test dose prior to incremental administration of local anesthetic. The patient tolerated the procedure well.  Ultrasound guidance: relevent anatomy identified, needle position confirmed, local anesthetic spread visualized around nerve(s), vascular puncture avoided.  Image printed for medical record.    Procedure Name: LMA Insertion Date/Time: 05/29/2013 9:57 AM Performed by: Genevieve NorlanderLINKA, Shia Eber L Pre-anesthesia Checklist: Patient identified, Emergency Drugs available, Suction available, Patient being monitored and Timeout performed Patient Re-evaluated:Patient Re-evaluated prior to inductionOxygen Delivery Method: Circle System Utilized Preoxygenation: Pre-oxygenation with 100% oxygen Intubation Type: IV induction Ventilation: Mask ventilation without difficulty LMA:  LMA inserted LMA Size: 4.0 Number of attempts: 1 Airway Equipment and Method: bite block Placement Confirmation: positive ETCO2 and breath sounds checked- equal and bilateral Tube secured with: Tape Dental Injury: Teeth and Oropharynx as per pre-operative assessment

## 2013-05-29 NOTE — Anesthesia Preprocedure Evaluation (Signed)
Anesthesia Evaluation  Patient identified by MRN, date of birth, ID band Patient awake    Reviewed: Allergy & Precautions, H&P , NPO status , Patient's Chart, lab work & pertinent test results  Airway Mallampati: II  Neck ROM: full    Dental   Pulmonary Current Smoker,          Cardiovascular hypertension,     Neuro/Psych    GI/Hepatic   Endo/Other    Renal/GU      Musculoskeletal   Abdominal   Peds  Hematology   Anesthesia Other Findings   Reproductive/Obstetrics                           Anesthesia Physical Anesthesia Plan  ASA: II  Anesthesia Plan: General and Regional   Post-op Pain Management:    Induction: Intravenous  Airway Management Planned: LMA  Additional Equipment:   Intra-op Plan:   Post-operative Plan:   Informed Consent: I have reviewed the patients History and Physical, chart, labs and discussed the procedure including the risks, benefits and alternatives for the proposed anesthesia with the patient or authorized representative who has indicated his/her understanding and acceptance.     Plan Discussed with: CRNA, Anesthesiologist and Surgeon  Anesthesia Plan Comments:         Anesthesia Quick Evaluation

## 2013-05-29 NOTE — Anesthesia Postprocedure Evaluation (Signed)
Anesthesia Post Note  Patient: Malik CrouchJeffrey S Mach  Procedure(s) Performed: Procedure(s) (LRB): Open reduction and internal fixation left ankle with repair syndesmosis (Left)  Anesthesia type: General  Patient location: PACU  Post pain: Pain level controlled and Adequate analgesia  Post assessment: Post-op Vital signs reviewed, Patient's Cardiovascular Status Stable, Respiratory Function Stable, Patent Airway and Pain level controlled  Last Vitals:  Filed Vitals:   05/29/13 1145  BP: 144/67  Pulse: 78  Temp:   Resp: 16    Post vital signs: Reviewed and stable  Level of consciousness: awake, alert  and oriented  Complications: No apparent anesthesia complications

## 2013-05-30 ENCOUNTER — Encounter (HOSPITAL_BASED_OUTPATIENT_CLINIC_OR_DEPARTMENT_OTHER): Payer: Self-pay | Admitting: Orthopedic Surgery

## 2013-05-30 NOTE — Addendum Note (Signed)
Addendum created 05/30/13 0913 by Lance CoonWesley Tavio Biegel, CRNA   Modules edited: Charges VN

## 2013-06-14 ENCOUNTER — Encounter (HOSPITAL_BASED_OUTPATIENT_CLINIC_OR_DEPARTMENT_OTHER): Payer: Self-pay | Admitting: *Deleted

## 2013-06-15 ENCOUNTER — Ambulatory Visit (HOSPITAL_BASED_OUTPATIENT_CLINIC_OR_DEPARTMENT_OTHER): Payer: 59 | Admitting: Anesthesiology

## 2013-06-15 ENCOUNTER — Encounter (HOSPITAL_BASED_OUTPATIENT_CLINIC_OR_DEPARTMENT_OTHER): Payer: Self-pay | Admitting: Anesthesiology

## 2013-06-15 ENCOUNTER — Encounter (HOSPITAL_BASED_OUTPATIENT_CLINIC_OR_DEPARTMENT_OTHER): Payer: 59 | Admitting: Anesthesiology

## 2013-06-15 ENCOUNTER — Encounter (HOSPITAL_BASED_OUTPATIENT_CLINIC_OR_DEPARTMENT_OTHER)
Admission: RE | Disposition: A | Discharge status: Home / Self Care | Payer: Self-pay | Source: Ambulatory Visit | Attending: Orthopedic Surgery

## 2013-06-15 ENCOUNTER — Ambulatory Visit (HOSPITAL_BASED_OUTPATIENT_CLINIC_OR_DEPARTMENT_OTHER)
Admission: RE | Admit: 2013-06-15 | Discharge: 2013-06-15 | Disposition: A | Discharge status: Home / Self Care | Payer: 59 | Source: Ambulatory Visit | Attending: Orthopedic Surgery | Admitting: Orthopedic Surgery

## 2013-06-15 DIAGNOSIS — S93439A Sprain of tibiofibular ligament of unspecified ankle, initial encounter: Secondary | ICD-10-CM | POA: Diagnosis not present

## 2013-06-15 DIAGNOSIS — F17200 Nicotine dependence, unspecified, uncomplicated: Secondary | ICD-10-CM | POA: Diagnosis not present

## 2013-06-15 DIAGNOSIS — T84498A Other mechanical complication of other internal orthopedic devices, implants and grafts, initial encounter: Secondary | ICD-10-CM | POA: Diagnosis not present

## 2013-06-15 DIAGNOSIS — G4730 Sleep apnea, unspecified: Secondary | ICD-10-CM | POA: Diagnosis not present

## 2013-06-15 DIAGNOSIS — Z7982 Long term (current) use of aspirin: Secondary | ICD-10-CM | POA: Diagnosis not present

## 2013-06-15 DIAGNOSIS — Y831 Surgical operation with implant of artificial internal device as the cause of abnormal reaction of the patient, or of later complication, without mention of misadventure at the time of the procedure: Secondary | ICD-10-CM | POA: Insufficient documentation

## 2013-06-15 DIAGNOSIS — S82899A Other fracture of unspecified lower leg, initial encounter for closed fracture: Secondary | ICD-10-CM | POA: Insufficient documentation

## 2013-06-15 DIAGNOSIS — I10 Essential (primary) hypertension: Secondary | ICD-10-CM | POA: Diagnosis not present

## 2013-06-15 DIAGNOSIS — Z880 Allergy status to penicillin: Secondary | ICD-10-CM | POA: Insufficient documentation

## 2013-06-15 HISTORY — PX: OPEN REDUCTION INTERNAL FIXATION (ORIF) TIBIA/FIBULA FRACTURE: SHX5992

## 2013-06-15 HISTORY — DX: Other fracture of left lower leg, initial encounter for closed fracture: S82.892A

## 2013-06-15 LAB — POCT HEMOGLOBIN-HEMACUE: Hemoglobin: 17 g/dL (ref 13.0–17.0)

## 2013-06-15 SURGERY — OPEN REDUCTION INTERNAL FIXATION (ORIF) TIBIA/FIBULA FRACTURE
Anesthesia: General | Site: Ankle | Laterality: Left

## 2013-06-15 MED ORDER — CLINDAMYCIN PHOSPHATE 900 MG/50ML IV SOLN
INTRAVENOUS | Status: AC
  Filled 2013-06-15: qty 50

## 2013-06-15 MED ORDER — KETOROLAC TROMETHAMINE 30 MG/ML IJ SOLN
30.0000 mg | Freq: Once | INTRAMUSCULAR | Status: AC
  Administered 2013-06-15: 30 mg via INTRAVENOUS
  Filled 2013-06-15: qty 2

## 2013-06-15 MED ORDER — ACETAMINOPHEN 325 MG PO TABS: 325.0000 mg | ORAL_TABLET | ORAL | Status: DC | PRN

## 2013-06-15 MED ORDER — MIDAZOLAM HCL 5 MG/5ML IJ SOLN
INTRAMUSCULAR | Status: DC | PRN
  Administered 2013-06-15: 2 mg via INTRAVENOUS

## 2013-06-15 MED ORDER — ASPIRIN 81 MG PO TABS: 81.0000 mg | ORAL_TABLET | Freq: Every day | ORAL | Status: AC

## 2013-06-15 MED ORDER — ONDANSETRON HCL 4 MG/2ML IJ SOLN
INTRAMUSCULAR | Status: DC | PRN
  Administered 2013-06-15: 4 mg via INTRAVENOUS

## 2013-06-15 MED ORDER — OXYCODONE HCL 5 MG PO TABS: 10.0000 mg | ORAL_TABLET | ORAL | Status: AC | PRN

## 2013-06-15 MED ORDER — OXYCODONE HCL 5 MG PO TABS: 5.0000 mg | ORAL_TABLET | Freq: Once | ORAL | Status: DC | PRN

## 2013-06-15 MED ORDER — ACETAMINOPHEN 160 MG/5ML PO SOLN: 325.0000 mg | ORAL | Status: DC | PRN

## 2013-06-15 MED ORDER — FENTANYL CITRATE 0.05 MG/ML IJ SOLN
INTRAMUSCULAR | Status: AC
  Filled 2013-06-15: qty 4

## 2013-06-15 MED ORDER — HYDROMORPHONE HCL PF 1 MG/ML IJ SOLN
0.2500 mg | INTRAMUSCULAR | Status: DC | PRN
  Administered 2013-06-15 (×2): 0.5 mg via INTRAVENOUS

## 2013-06-15 MED ORDER — MORPHINE SULFATE 10 MG/ML IJ SOLN
INTRAMUSCULAR | Status: AC
  Filled 2013-06-15: qty 1

## 2013-06-15 MED ORDER — MIDAZOLAM HCL 2 MG/2ML IJ SOLN
1.0000 mg | INTRAMUSCULAR | Status: DC | PRN
Start: 2013-06-15 — End: 2013-06-15
  Administered 2013-06-15: 2 mg via INTRAVENOUS

## 2013-06-15 MED ORDER — LACTATED RINGERS IV SOLN
INTRAVENOUS | Status: DC
  Administered 2013-06-15 (×2): via INTRAVENOUS

## 2013-06-15 MED ORDER — LIDOCAINE HCL (CARDIAC) 20 MG/ML IV SOLN
INTRAVENOUS | Status: DC | PRN
  Administered 2013-06-15: 75 mg via INTRAVENOUS

## 2013-06-15 MED ORDER — MIDAZOLAM HCL 2 MG/2ML IJ SOLN
INTRAMUSCULAR | Status: AC
  Filled 2013-06-15: qty 2

## 2013-06-15 MED ORDER — DEXAMETHASONE SODIUM PHOSPHATE 4 MG/ML IJ SOLN
INTRAMUSCULAR | Status: DC | PRN
  Administered 2013-06-15: 10 mg via INTRAVENOUS

## 2013-06-15 MED ORDER — HYDROMORPHONE HCL PF 1 MG/ML IJ SOLN
INTRAMUSCULAR | Status: AC
  Filled 2013-06-15: qty 1

## 2013-06-15 MED ORDER — LIDOCAINE-EPINEPHRINE (PF) 1.5 %-1:200000 IJ SOLN
INTRAMUSCULAR | Status: DC | PRN
  Administered 2013-06-15: 10 mL via PERINEURAL

## 2013-06-15 MED ORDER — MIDAZOLAM HCL 2 MG/2ML IJ SOLN: 2.0000 mg | Freq: Once | INTRAMUSCULAR | Status: DC

## 2013-06-15 MED ORDER — CLINDAMYCIN PHOSPHATE 900 MG/50ML IV SOLN
900.0000 mg | INTRAVENOUS | Status: AC
  Administered 2013-06-15: 900 mg via INTRAVENOUS

## 2013-06-15 MED ORDER — FENTANYL CITRATE 0.05 MG/ML IJ SOLN
INTRAMUSCULAR | Status: DC | PRN
  Administered 2013-06-15: 50 ug via INTRAVENOUS
  Administered 2013-06-15: 100 ug via INTRAVENOUS
  Administered 2013-06-15: 50 ug via INTRAVENOUS

## 2013-06-15 MED ORDER — BUPIVACAINE HCL (PF) 0.5 % IJ SOLN
INTRAMUSCULAR | Status: AC
  Filled 2013-06-15: qty 30

## 2013-06-15 MED ORDER — ROPIVACAINE HCL 5 MG/ML IJ SOLN
INTRAMUSCULAR | Status: DC | PRN
  Administered 2013-06-15: 15 mL via PERINEURAL
  Administered 2013-06-15: 30 mL via PERINEURAL

## 2013-06-15 MED ORDER — OXYCODONE HCL 5 MG/5ML PO SOLN: 5.0000 mg | Freq: Once | ORAL | Status: DC | PRN

## 2013-06-15 MED ORDER — FENTANYL CITRATE 0.05 MG/ML IJ SOLN
INTRAMUSCULAR | Status: AC
  Filled 2013-06-15: qty 6

## 2013-06-15 MED ORDER — DOCUSATE SODIUM 100 MG PO CAPS: 100.0000 mg | ORAL_CAPSULE | Freq: Two times a day (BID) | ORAL | Status: AC

## 2013-06-15 MED ORDER — ACETAMINOPHEN 500 MG PO TABS
ORAL_TABLET | ORAL | Status: AC
  Filled 2013-06-15: qty 2

## 2013-06-15 MED ORDER — DEXTROSE-NACL 5-0.45 % IV SOLN: 100.0000 mL/h | INTRAVENOUS | Status: DC

## 2013-06-15 MED ORDER — MORPHINE SULFATE 10 MG/ML IJ SOLN
INTRAMUSCULAR | Status: DC | PRN
  Administered 2013-06-15 (×2): 5 mg via INTRAVENOUS

## 2013-06-15 MED ORDER — ACETAMINOPHEN 500 MG PO TABS
1000.0000 mg | ORAL_TABLET | Freq: Once | ORAL | Status: AC
  Administered 2013-06-15: 1000 mg via ORAL

## 2013-06-15 MED ORDER — PROMETHAZINE HCL 25 MG/ML IJ SOLN: 6.2500 mg | INTRAMUSCULAR | Status: DC | PRN

## 2013-06-15 MED ORDER — FENTANYL CITRATE 0.05 MG/ML IJ SOLN: 50.0000 ug | INTRAMUSCULAR | Status: DC | PRN

## 2013-06-15 MED ORDER — MIDAZOLAM HCL 2 MG/ML PO SYRP: 12.0000 mg | ORAL_SOLUTION | Freq: Once | ORAL | Status: DC | PRN

## 2013-06-15 MED ORDER — ONDANSETRON HCL 4 MG PO TABS: 4.0000 mg | ORAL_TABLET | Freq: Three times a day (TID) | ORAL | Status: AC | PRN

## 2013-06-15 MED ORDER — PROPOFOL 10 MG/ML IV BOLUS
INTRAVENOUS | Status: DC | PRN
  Administered 2013-06-15: 200 mg via INTRAVENOUS

## 2013-06-15 SURGICAL SUPPLY — 58 items
4.0 x 50mm cancellous screw ×3 IMPLANT
BANDAGE ELASTIC 4 VELCRO ST LF (GAUZE/BANDAGES/DRESSINGS) IMPLANT
BANDAGE ELASTIC 6 VELCRO ST LF (GAUZE/BANDAGES/DRESSINGS) ×3 IMPLANT
BANDAGE ESMARK 6X9 LF (GAUZE/BANDAGES/DRESSINGS) ×1 IMPLANT
BLADE 15 SAFETY STRL DISP (BLADE) ×6 IMPLANT
BNDG COHESIVE 4X5 TAN STRL (GAUZE/BANDAGES/DRESSINGS) IMPLANT
BNDG ESMARK 6X9 LF (GAUZE/BANDAGES/DRESSINGS) ×3
CHLORAPREP W/TINT 26ML (MISCELLANEOUS) ×3 IMPLANT
CLOSURE WOUND 1/2 X4 (GAUZE/BANDAGES/DRESSINGS) ×1
COVER MAYO STAND STRL (DRAPES) ×3 IMPLANT
COVER TABLE BACK 60X90 (DRAPES) ×3 IMPLANT
CUFF TOURNIQUET SINGLE 24IN (TOURNIQUET CUFF) ×3 IMPLANT
CUFF TOURNIQUET SINGLE 34IN LL (TOURNIQUET CUFF) IMPLANT
DECANTER SPIKE VIAL GLASS SM (MISCELLANEOUS) IMPLANT
DRAPE C-ARM 42X72 X-RAY (DRAPES) IMPLANT
DRAPE C-ARMOR (DRAPES) ×3 IMPLANT
DRAPE EXTREMITY T 121X128X90 (DRAPE) ×3 IMPLANT
DRAPE OEC MINIVIEW 54X84 (DRAPES) ×3 IMPLANT
DRAPE U 20/CS (DRAPES) ×3 IMPLANT
DRAPE U-SHAPE 47X51 STRL (DRAPES) ×3 IMPLANT
DRSG EMULSION OIL 3X3 NADH (GAUZE/BANDAGES/DRESSINGS) ×3 IMPLANT
ELECT REM PT RETURN 9FT ADLT (ELECTROSURGICAL) ×3
ELECTRODE REM PT RTRN 9FT ADLT (ELECTROSURGICAL) ×1 IMPLANT
GAUZE SPONGE 4X4 12PLY STRL (GAUZE/BANDAGES/DRESSINGS) ×3 IMPLANT
GLOVE BIO SURGEON STRL SZ7.5 (GLOVE) ×3 IMPLANT
GLOVE BIO SURGEON STRL SZ8 (GLOVE) ×3 IMPLANT
GLOVE BIOGEL PI IND STRL 8 (GLOVE) ×1 IMPLANT
GLOVE BIOGEL PI INDICATOR 8 (GLOVE) ×2
GOWN STRL REUS W/ TWL LRG LVL3 (GOWN DISPOSABLE) ×4 IMPLANT
GOWN STRL REUS W/ TWL XL LVL3 (GOWN DISPOSABLE) ×1 IMPLANT
GOWN STRL REUS W/TWL LRG LVL3 (GOWN DISPOSABLE) ×8
GOWN STRL REUS W/TWL XL LVL3 (GOWN DISPOSABLE) ×2
NEEDLE HYPO 22GX1.5 SAFETY (NEEDLE) IMPLANT
NS IRRIG 1000ML POUR BTL (IV SOLUTION) ×3 IMPLANT
PACK BASIN DAY SURGERY FS (CUSTOM PROCEDURE TRAY) ×3 IMPLANT
PAD CAST 4YDX4 CTTN HI CHSV (CAST SUPPLIES) ×2 IMPLANT
PADDING CAST COTTON 4X4 STRL (CAST SUPPLIES) ×4
PADDING CAST COTTON 6X4 STRL (CAST SUPPLIES) ×3 IMPLANT
PENCIL BUTTON HOLSTER BLD 10FT (ELECTRODE) ×3 IMPLANT
REPAIR TROPE KNTLS SS SYNDESMO (Orthopedic Implant) ×3 IMPLANT
SLEEVE SCD COMPRESS KNEE MED (MISCELLANEOUS) ×3 IMPLANT
SPLINT FAST PLASTER 5X30 (CAST SUPPLIES) ×30
SPLINT PLASTER CAST FAST 5X30 (CAST SUPPLIES) ×15 IMPLANT
SPONGE LAP 4X18 X RAY DECT (DISPOSABLE) ×3 IMPLANT
STRIP CLOSURE SKIN 1/2X4 (GAUZE/BANDAGES/DRESSINGS) ×2 IMPLANT
SUCTION FRAZIER TIP 10 FR DISP (SUCTIONS) ×3 IMPLANT
SUT MON AB 2-0 CT1 36 (SUTURE) ×3 IMPLANT
SUT MON AB 4-0 PC3 18 (SUTURE) ×3 IMPLANT
SUT VIC AB 0 SH 27 (SUTURE) ×3 IMPLANT
SUT VIC AB 2-0 SH 27 (SUTURE)
SUT VIC AB 2-0 SH 27XBRD (SUTURE) IMPLANT
SYR 20CC LL (SYRINGE) ×3 IMPLANT
SYR BULB 3OZ (MISCELLANEOUS) ×3 IMPLANT
TOWEL OR 17X24 6PK STRL BLUE (TOWEL DISPOSABLE) ×6 IMPLANT
TOWEL OR NON WOVEN STRL DISP B (DISPOSABLE) ×3 IMPLANT
TUBE CONNECTING 20'X1/4 (TUBING) ×1
TUBE CONNECTING 20X1/4 (TUBING) ×2 IMPLANT
UNDERPAD 30X30 INCONTINENT (UNDERPADS AND DIAPERS) ×3 IMPLANT

## 2013-06-15 NOTE — Anesthesia Postprocedure Evaluation (Signed)
  Anesthesia Post-op Note  Patient: Malik CrouchJeffrey S Hansen  Procedure(s) Performed: Procedure(s): OPEN REDUCTION INTERNAL FIXATION (ORIF) LEFT DISTAL TIBIA/FIBULA FRACTURE (Left)  Patient Location: PACU  Anesthesia Type:GA combined with regional for post-op pain  Level of Consciousness: awake and alert   Airway and Oxygen Therapy: Patient Spontanous Breathing  Post-op Pain: mild  Post-op Assessment: Post-op Vital signs reviewed, Patient's Cardiovascular Status Stable, Respiratory Function Stable, Patent Airway, No signs of Nausea or vomiting and Pain level controlled  Post-op Vital Signs: Reviewed and stable  Last Vitals:  Filed Vitals:   06/15/13 1600  BP: 146/92  Pulse: 75  Temp:   Resp: 17    Complications: No apparent anesthesia complications

## 2013-06-15 NOTE — Anesthesia Procedure Notes (Addendum)
Procedure Name: LMA Insertion Date/Time: 06/15/2013 1:42 PM Performed by: Zenia ResidesPAYNE, LINDA D Pre-anesthesia Checklist: Patient identified, Emergency Drugs available, Suction available and Patient being monitored Patient Re-evaluated:Patient Re-evaluated prior to inductionOxygen Delivery Method: Circle System Utilized Preoxygenation: Pre-oxygenation with 100% oxygen Intubation Type: IV induction Ventilation: Mask ventilation without difficulty LMA: LMA inserted LMA Size: 5.0 Number of attempts: 1 Airway Equipment and Method: bite block Placement Confirmation: positive ETCO2 Tube secured with: Tape Dental Injury: Teeth and Oropharynx as per pre-operative assessment     Anesthesia Regional Block:  Popliteal block  Pre-Anesthetic Checklist: ,, timeout performed, Correct Patient, Correct Site, Correct Laterality, Correct Procedure, Correct Position, site marked, Risks and benefits discussed,  Surgical consent,  Pre-op evaluation,  At surgeon's request and post-op pain management  Laterality: Lower and Left  Prep: chloraprep       Needles:  Injection technique: Single-shot  Needle Type: Echogenic Needle          Additional Needles:  Procedures: ultrasound guided (picture in chart) Popliteal block Narrative:  Start time: 06/15/2013 3:21 PM End time: 06/15/2013 3:41 PM Injection made incrementally with aspirations every 5 mL.  Performed by: Personally  Anesthesiologist: Anjuli Gemmill  Additional Notes: H+P and labs reviewed, risks and benefits discussed with patient, procedure tolerated well without complications. Supplemented with Left saphenous block under US guidance

## 2013-06-15 NOTE — Anesthesia Preprocedure Evaluation (Signed)
Anesthesia Evaluation  Patient identified by MRN, date of birth, ID band Patient awake    Reviewed: Allergy & Precautions, H&P , NPO status , Patient's Chart, lab work & pertinent test results  History of Anesthesia Complications Negative for: history of anesthetic complications  Airway Mallampati: II TM Distance: >3 FB Neck ROM: Full    Dental  (+) Teeth Intact   Pulmonary neg sleep apnea, neg COPDCurrent Smoker,  breath sounds clear to auscultation        Cardiovascular hypertension, negative cardio ROS  Rhythm:Regular     Neuro/Psych negative neurological ROS  negative psych ROS   GI/Hepatic negative GI ROS, Neg liver ROS,   Endo/Other  negative endocrine ROS  Renal/GU negative Renal ROS     Musculoskeletal   Abdominal   Peds  Hematology negative hematology ROS (+)   Anesthesia Other Findings   Reproductive/Obstetrics                           Anesthesia Physical Anesthesia Plan  ASA: II  Anesthesia Plan: General   Post-op Pain Management:    Induction: Intravenous  Airway Management Planned: LMA  Additional Equipment: None  Intra-op Plan:   Post-operative Plan: Extubation in OR  Informed Consent: I have reviewed the patients History and Physical, chart, labs and discussed the procedure including the risks, benefits and alternatives for the proposed anesthesia with the patient or authorized representative who has indicated his/her understanding and acceptance.   Dental advisory given  Plan Discussed with: CRNA and Surgeon  Anesthesia Plan Comments:         Anesthesia Quick Evaluation

## 2013-06-15 NOTE — Transfer of Care (Signed)
Immediate Anesthesia Transfer of Care Note  Patient: Malik CrouchJeffrey S Hansen  Procedure(s) Performed: Procedure(s): OPEN REDUCTION INTERNAL FIXATION (ORIF) LEFT DISTAL TIBIA/FIBULA FRACTURE (Left)  Patient Location: PACU  Anesthesia Type:General  Level of Consciousness: awake, alert  and oriented  Airway & Oxygen Therapy: Patient Spontanous Breathing and Patient connected to face mask oxygen  Post-op Assessment: Report given to PACU RN and Post -op Vital signs reviewed and stable  Post vital signs: Reviewed and stable  Complications: No apparent anesthesia complications

## 2013-06-15 NOTE — Op Note (Signed)
06/15/2013  2:27 PM  PATIENT:  [name]    PRE-OPERATIVE DIAGNOSIS:  Left Ankle: Fracture Ankle Unspecified - Closed    POST-OPERATIVE DIAGNOSIS:  Same  PROCEDURE:  OPEN REDUCTION INTERNAL FIXATION (ORIF) LEFT DISTAL TIBIA/FIBULA FRACTURE  SURGEON:  Sheral Apleyimothy D Cleven Jansma, MD  ASSISTANT: Janace LittenBrandon Parry OPA  ANESTHESIA:   gen  PREOPERATIVE INDICATIONS:  Malik CrouchJeffrey S Blazejewski is a  33 y.o. male with a diagnosis of Left Ankle: Fracture Ankle Unspecified - Closed   who failed conservative measures and elected for surgical management.    The risks benefits and alternatives were discussed with the patient preoperatively including but not limited to the risks of infection, bleeding, nerve injury, cardiopulmonary complications, the need for revision surgery, among others, and the patient was willing to proceed.  OPERATIVE IMPLANTS: tight rope, 4.5 cancellous screw  OPERATIVE FINDINGS: ruptured tight rope  BLOOD LOSS: min  COMPLICATIONS: none  TOURNIQUET TIME: 0  OPERATIVE PROCEDURE:  Patient was identified in the preoperative holding area and site was marked by me He was transported to the operating theater and placed on the table in supine position taking care to pad all bony prominences. After a preincinduction time out anesthesia was induced. The left lower extremity was prepped and draped in normal sterile fashion and a pre-incision timeout was performed. He received clinda for preoperative antibiotics.   At the arthroscopic images confirming an unstable syndesmosis. I then made an incision on the medial side just over the medial Endobutton for the tight rope estimated incision on the lateral side over the placement for the new tight rope and her previous tight rope was. I dissected down with spreading dissection to the bone identified the previous tight rope of one of the suture bands is intact available and had ruptured allowing it to loosen I removed both endplates of the tight rope as  well as all the suture which came out hole identified the rupture spot. I then decided to use a tight rope again about this one to affect a appropriate reduction and hopefully hold this time I would practice up with a track cortical cancellus screw at the previous tight rope spot as this was a 3 mm drill bit to place that tight rope.  I drilled a new spot for the tight rope just above the previous one was done x-ray and was happy with his placement on both and AP and lateral x-ray. I then passed the tight rope watch the button flipped and confirmed appropriate reduction after tightening this down. Mortise was unstable. The back this up I then selected a 50 mm 40 cancellus screw and inserted it at the previously drilled site for the tight rope this had an excellent bite. I then stressed the syndesmosis on x-ray and was happy with its fixation.  I then thoroughly irrigated both wounds and closed them with a nylon stitch. Sterile dressing was applied and I short-leg splint  POST OPERATIVE PLAN: He'll be nonweightbearing he'll elevate it he'll take an aspirin 81 mg daily.

## 2013-06-15 NOTE — Discharge Instructions (Signed)
Elevate the leg  No weight on your leg   Regional Anesthesia Blocks  1. Numbness or the inability to move the "blocked" extremity may last from 3-48 hours after placement. The length of time depends on the medication injected and your individual response to the medication. If the numbness is not going away after 48 hours, call your surgeon.  2. The extremity that is blocked will need to be protected until the numbness is gone and the  Strength has returned. Because you cannot feel it, you will need to take extra care to avoid injury. Because it may be weak, you may have difficulty moving it or using it. You may not know what position it is in without looking at it while the block is in effect.  3. For blocks in the legs and feet, returning to weight bearing and walking needs to be done carefully. You will need to wait until the numbness is entirely gone and the strength has returned. You should be able to move your leg and foot normally before you try and bear weight or walk. You will need someone to be with you when you first try to ensure you do not fall and possibly risk injury.  4. Bruising and tenderness at the needle site are common side effects and will resolve in a few days.  5. Persistent numbness or new problems with movement should be communicated to the surgeon or the [facility] [phone])/ [facility]  [phone]).   Post Anesthesia Home Care Instructions  Activity: Get plenty of rest for the remainder of the day. A responsible adult should stay with you for 24 hours following the procedure.  For the next 24 hours, DO NOT: -Drive a car -Advertising copywriterperate machinery -Drink alcoholic beverages -Take any medication unless instructed by your physician -Make any legal decisions or sign important papers.  Meals: Start with liquid foods such as gelatin or soup. Progress to regular foods as tolerated. Avoid greasy, spicy, heavy foods. If nausea and/or vomiting  occur, drink only clear liquids until the nausea and/or vomiting subsides. Call your physician if vomiting continues.  Special Instructions/Symptoms: Your throat may feel dry or sore from the anesthesia or the breathing tube placed in your throat during surgery. If this causes discomfort, gargle with warm salt water. The discomfort should disappear within 24 hours.

## 2013-06-15 NOTE — Interval H&P Note (Signed)
History and Physical Interval Note:  06/15/2013 12:17 PM  Neil CrouchJeffrey S Lamontagne  has presented today for surgery, with the diagnosis of Left Ankle: Fracture Ankle Unspecified - Closed    The various methods of treatment have been discussed with the patient and family. After consideration of risks, benefits and other options for treatment, the patient has consented to  Procedure(s): OPEN REDUCTION INTERNAL FIXATION (ORIF) LEFT DISTAL TIBIA/FIBULA FRACTURE (Left) as a surgical intervention .  The patient's history has been reviewed, patient examined, no change in status, stable for surgery.  I have reviewed the patient's chart and labs.  Questions were answered to the patient's satisfaction.     Sheral Apleyimothy D Murphy

## 2013-06-15 NOTE — H&P (Signed)
      ORTHOPAEDIC CONSULTATION  REQUESTING PHYSICIAN: Renette Butters, MD  Chief Complaint: ruptured syndesmosis fixation  HPI: [name] is a 33 y.o. male who complains of  Failed syndesmosis  Past Medical History  Diagnosis Date  . Hypertension     states recently diagnosed - no current med.  . Ankle fracture, left 05/2013   Past Surgical History  Procedure Laterality Date  . Mandible surgery      for realignment  . Bladder repair  age 40  . Orif ankle fracture Left 05/29/2013    Procedure: Open reduction and internal fixation left ankle with repair syndesmosis;  Surgeon: Renette Butters, MD;  Location: [facility];  Service: Orthopedics;  Laterality: Left;   History   Social History  . Marital Status: Single    Spouse Name: N/A    Number of Children: N/A  . Years of Education: N/A   Social History Main Topics  . Smoking status: Current Every Day Smoker -- 0.50 packs/day for 10 years    Types: Cigarettes  . Smokeless tobacco: Never Used  . Alcohol Use: Yes     Comment: 2 x/week  . Drug Use: No  . Sexual Activity: None   Other Topics Concern  . None   Social History Narrative  . None   Family History  Problem Relation Age of Onset  . Diabetes Father   . Leukemia Father   . COPD Father    Allergies  Allergen Reactions  . Penicillins Other (See Comments)    UNKNOWN   Prior to Admission medications   Medication Sig Start Date End Date Taking? Authorizing Provider  aspirin 81 MG tablet Take 1 tablet (81 mg total) by mouth daily. 05/29/13  Yes Renette Butters, MD  oxyCODONE (ROXICODONE) 5 MG immediate release tablet Take 2 tablets (10 mg total) by mouth every 4 (four) hours as needed for severe pain. 05/29/13  Yes Renette Butters, MD   No results found.  Positive ROS: All other systems have been reviewed and were otherwise negative with the exception of those mentioned in the HPI and as above.  Labs cbc No results found for this  basename: WBC, HGB, HCT, PLT,  in the last 72 hours  Labs inflam No results found for this basename: ESR, CRP,  in the last 72 hours  Labs coag No results found for this basename: INR, PT, PTT,  in the last 72 hours  No results found for this basename: NA, K, CL, CO2, GLUCOSE, BUN, CREATININE, CALCIUM,  in the last 72 hours  Physical Exam: There were no vitals filed for this visit. General: Alert, no acute distress Cardiovascular: No pedal edema Respiratory: No cyanosis, no use of accessory musculature GI: No organomegaly, abdomen is soft and non-tender Skin: No lesions in the area of chief complaint Neurologic: Sensation intact distally Psychiatric: Patient is competent for consent with normal mood and affect Lymphatic: No axillary or cervical lymphadenopathy  MUSCULOSKELETAL:  Skin benign Other extremities are atraumatic with painless ROM and NVI.  Assessment: Ruptured syndesmosis  Plan: Repair of syndesmosis    Renette Butters, MD Cell [phone]   06/15/2013 12:16 PM

## 2013-06-15 NOTE — Progress Notes (Signed)
AssistedDr. Moser with left, ultrasound guided, popliteal/saphenous block. Side rails up, monitors on throughout procedure. See vital signs in flow sheet. Tolerated Procedure well.  

## 2013-06-18 NOTE — Addendum Note (Signed)
Addendum created 06/18/13 0751 by Lance [facility] Rhya Shan, CRNA   Modules edited: Charges VN

## 2013-06-20 ENCOUNTER — Encounter (HOSPITAL_BASED_OUTPATIENT_CLINIC_OR_DEPARTMENT_OTHER): Payer: Self-pay | Admitting: Orthopedic Surgery
